# Patient Record
Sex: Female | Born: 2000
Health system: Southern US, Community
[De-identification: ages and names within clinical notes are randomized; demographics above are authoritative.]

## PROBLEM LIST (undated history)

## (undated) DIAGNOSIS — F32A Depression, unspecified: Secondary | ICD-10-CM

## (undated) DIAGNOSIS — B2790 Infectious mononucleosis, unspecified without complication: Secondary | ICD-10-CM

## (undated) DIAGNOSIS — F419 Anxiety disorder, unspecified: Secondary | ICD-10-CM

## (undated) DIAGNOSIS — Z1589 Genetic susceptibility to other disease: Secondary | ICD-10-CM

## (undated) HISTORY — DX: Anxiety disorder, unspecified: F41.9

## (undated) HISTORY — DX: Depression, unspecified: F32.A

## (undated) HISTORY — PX: NO PAST SURGERIES: SHX2092

---
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
## 2022-01-10 DIAGNOSIS — S060X0A Concussion without loss of consciousness, initial encounter: Secondary | ICD-10-CM | POA: Diagnosis not present

## 2022-01-10 DIAGNOSIS — R110 Nausea: Secondary | ICD-10-CM | POA: Diagnosis not present

## 2022-01-29 DIAGNOSIS — R69 Illness, unspecified: Secondary | ICD-10-CM | POA: Diagnosis not present

## 2022-01-29 DIAGNOSIS — F411 Generalized anxiety disorder: Secondary | ICD-10-CM | POA: Diagnosis not present

## 2022-02-26 DIAGNOSIS — R69 Illness, unspecified: Secondary | ICD-10-CM | POA: Diagnosis not present

## 2022-02-26 DIAGNOSIS — F411 Generalized anxiety disorder: Secondary | ICD-10-CM | POA: Diagnosis not present

## 2022-02-27 DIAGNOSIS — S46912A Strain of unspecified muscle, fascia and tendon at shoulder and upper arm level, left arm, initial encounter: Secondary | ICD-10-CM | POA: Diagnosis not present

## 2022-03-07 DIAGNOSIS — B349 Viral infection, unspecified: Secondary | ICD-10-CM | POA: Diagnosis not present

## 2022-03-07 DIAGNOSIS — J029 Acute pharyngitis, unspecified: Secondary | ICD-10-CM | POA: Diagnosis not present

## 2022-03-28 DIAGNOSIS — Z713 Dietary counseling and surveillance: Secondary | ICD-10-CM | POA: Diagnosis not present

## 2022-03-28 DIAGNOSIS — Z23 Encounter for immunization: Secondary | ICD-10-CM | POA: Diagnosis not present

## 2022-03-28 DIAGNOSIS — Z0001 Encounter for general adult medical examination with abnormal findings: Secondary | ICD-10-CM | POA: Diagnosis not present

## 2022-03-28 DIAGNOSIS — Z68.41 Body mass index (BMI) pediatric, 5th percentile to less than 85th percentile for age: Secondary | ICD-10-CM | POA: Diagnosis not present

## 2022-03-28 DIAGNOSIS — N921 Excessive and frequent menstruation with irregular cycle: Secondary | ICD-10-CM | POA: Diagnosis not present

## 2022-03-28 DIAGNOSIS — R69 Illness, unspecified: Secondary | ICD-10-CM | POA: Diagnosis not present

## 2022-04-20 DIAGNOSIS — H209 Unspecified iridocyclitis: Secondary | ICD-10-CM | POA: Diagnosis not present

## 2022-04-27 DIAGNOSIS — R5383 Other fatigue: Secondary | ICD-10-CM | POA: Diagnosis not present

## 2022-04-27 DIAGNOSIS — A0839 Other viral enteritis: Secondary | ICD-10-CM | POA: Diagnosis not present

## 2022-04-27 DIAGNOSIS — R112 Nausea with vomiting, unspecified: Secondary | ICD-10-CM | POA: Diagnosis not present

## 2022-05-03 DIAGNOSIS — H209 Unspecified iridocyclitis: Secondary | ICD-10-CM | POA: Diagnosis not present

## 2022-05-08 ENCOUNTER — Encounter: Payer: Self-pay | Admitting: Obstetrics and Gynecology

## 2022-05-08 ENCOUNTER — Ambulatory Visit (INDEPENDENT_AMBULATORY_CARE_PROVIDER_SITE_OTHER): Payer: 59 | Admitting: Obstetrics and Gynecology

## 2022-05-08 ENCOUNTER — Other Ambulatory Visit (HOSPITAL_COMMUNITY)
Admission: RE | Admit: 2022-05-08 | Discharge: 2022-05-08 | Disposition: A | Discharge status: Home / Self Care | Payer: 59 | Source: Ambulatory Visit | Attending: Obstetrics and Gynecology | Admitting: Obstetrics and Gynecology

## 2022-05-08 VITALS — BP 90/60 | Ht 66.0 in | Wt 139.0 lb

## 2022-05-08 DIAGNOSIS — Z113 Encounter for screening for infections with a predominantly sexual mode of transmission: Secondary | ICD-10-CM | POA: Insufficient documentation

## 2022-05-08 DIAGNOSIS — N898 Other specified noninflammatory disorders of vagina: Secondary | ICD-10-CM | POA: Diagnosis not present

## 2022-05-08 DIAGNOSIS — Z30011 Encounter for initial prescription of contraceptive pills: Secondary | ICD-10-CM

## 2022-05-08 DIAGNOSIS — R69 Illness, unspecified: Secondary | ICD-10-CM | POA: Diagnosis not present

## 2022-05-08 LAB — POCT WET PREP WITH KOH
Clue Cells Wet Prep HPF POC: NEGATIVE
KOH Prep POC: NEGATIVE
Trichomonas, UA: NEGATIVE
Yeast Wet Prep HPF POC: NEGATIVE

## 2022-05-08 MED ORDER — DROSPIRENONE-ETHINYL ESTRADIOL 3-0.02 MG PO TABS: 1.0000 | ORAL_TABLET | Freq: Every day | ORAL | 3 refills | Status: AC

## 2022-05-08 NOTE — Progress Notes (Signed)
? ? ?[facility] ? ? ?Chief Complaint  ?Patient presents with  ? Vaginal Discharge  ?  Sour, itchiness and irritation x 2-3 days  ? ? ?HPI: ?     Sarah Garner is a 21 y.o. G0P0000 whose LMP was Patient's last menstrual period was 04/24/2022 (approximate)., presents today for increased vag d/c with irritation, sour odor, no fishy odor, dysuria without other UTI sx for a few days. Hx of yeast vag in past but sx aren't the same. No yeast meds to treat, no recent abx use. She is sex active, using condoms. Has noticed some vag discomfort with sex the past month. Due for yearly STD testing.  ?She is using condoms. Did lomedia last yr but increased her anxiety/depression sx so stopped it. Interested in [place] again. Hx of irreg menses. ? ? ?Past Medical History:  ?Diagnosis Date  ? Anxiety   ? Depression   ? HLA B27 (HLA B27 positive)   ? Mononucleosis   ? ? ? ?Past Surgical History:  ?Procedure Laterality Date  ? NO PAST SURGERIES    ? ? ?History reviewed. No pertinent family history. ? ?Social History  ? ?Socioeconomic History  ? Marital status: Single  ?  Spouse name: Not on file  ? Number of children: Not on file  ? Years of education: Not on file  ? Highest education level: Not on file  ?Occupational History  ? Not on file  ?Tobacco Use  ? Smoking status: Never  ? Smokeless tobacco: Never  ?Vaping Use  ? Vaping Use: Never used  ?Substance and Sexual Activity  ? Alcohol use: Never  ? Drug use: Never  ? Sexual activity: Yes  ?  Birth control/protection: None, Condom  ?Other Topics Concern  ? Not on file  ?Social History Narrative  ? Not on file  ? ?Social Determinants of Health  ? ?Financial Resource Strain: Not on file  ?Food Insecurity: Not on file  ?Transportation Needs: Not on file  ?Physical Activity: Not on file  ?Stress: Not on file  ?Social Connections: Not on file  ?Intimate Partner Violence: Not on file  ? ? ?Outpatient Medications Prior to Visit  ?Medication Sig Dispense Refill  ?  ARIPiprazole (ABILIFY) 2 MG tablet Take 2 mg by mouth daily.    ? Cranberry 400 MG CAPS Take by mouth.    ? Difluprednate 0.05 % EMUL Apply to eye.    ? DULoxetine HCl 40 MG CPEP     ? Multiple Vitamin (MULTI-VITAMIN) tablet Take by mouth.    ? ondansetron (ZOFRAN-ODT) 4 MG disintegrating tablet Take 4 mg by mouth every 6 (six) hours as needed.    ? Probiotic CAPS Take by mouth.    ? QUEtiapine (SEROQUEL) 50 MG tablet     ? fluconazole (DIFLUCAN) 150 MG tablet TAKE 1 TABLET(150 MG) BY MOUTH 1 TIME FOR 1 DOSE. MAY REPEAT IN 3 DAYS IF STILL HAVING SYMPTOMS 2 tablet 0  ? FLUoxetine (PROZAC) 40 MG capsule Take 40 mg by mouth daily.    ? meloxicam (MOBIC) 15 MG tablet Take 1 tablet (15 mg total) by mouth daily. 30 tablet 0  ? methocarbamol (ROBAXIN) 500 MG tablet Take 1 tablet (500 mg total) by mouth 4 (four) times daily. 28 tablet 0  ? Norethindrone Acetate-Ethinyl Estrad-FE (MICROGESTIN 24 FE) 1-20 MG-MCG(24) tablet Take 1 tablet by mouth daily. 84 tablet 3  ? ?No facility-administered medications prior to visit.  ? ? ? ? ?ROS: ? ?  Review of Systems  ?Constitutional:  Negative for fever.  ?Gastrointestinal:  Negative for blood in stool, constipation, diarrhea, nausea and vomiting.  ?Genitourinary:  Positive for dysuria and vaginal discharge. Negative for dyspareunia, flank pain, frequency, hematuria, urgency, vaginal bleeding and vaginal pain.  ?Musculoskeletal:  Negative for back pain.  ?Skin:  Negative for rash.  ?BREAST: No symptoms ? ? ?OBJECTIVE:  ? ?Vitals:  ?BP 90/60   Ht $R'5\' 6"'Ji$  (1.676 m)   Wt 139 lb (63 kg)   LMP 04/24/2022 (Approximate)   BMI 22.44 kg/m?  ? ?Physical Exam ?Vitals reviewed.  ?Constitutional:   ?   Appearance: She is well-developed.  ?Pulmonary:  ?   Effort: Pulmonary effort is normal.  ?Genitourinary: ?   General: Normal vulva.  ?   Pubic Area: No rash.   ?   Labia:     ?   Right: No rash, tenderness or lesion.     ?   Left: No rash, tenderness or lesion.   ?   Vagina: Vaginal discharge  present. No erythema or tenderness.  ?   Cervix: Normal.  ?   Uterus: Normal. Not enlarged and not tender.   ?   Adnexa: Right adnexa normal and left adnexa normal.    ?   Right: No mass or tenderness.      ?   Left: No mass or tenderness.    ?Musculoskeletal:     ?   General: Normal range of motion.  ?   Cervical back: Normal range of motion.  ?Skin: ?   General: Skin is warm and dry.  ?Neurological:  ?   General: No focal deficit present.  ?   Mental Status: She is alert and oriented to person, place, and time.  ?Psychiatric:     ?   Mood and Affect: Mood normal.     ?   Behavior: Behavior normal.     ?   Thought Content: Thought content normal.     ?   Judgment: Judgment normal.  ? ? ?Results: ?Results for orders placed or performed in visit on 05/08/22 (from the past 24 hour(s))  ?POCT Wet Prep with KOH     Status: Normal  ? Collection Time: 05/08/22  4:42 PM  ?Result Value Ref Range  ? Trichomonas, UA Negative   ? Clue Cells Wet Prep HPF POC neg   ? Epithelial Wet Prep HPF POC    ? Yeast Wet Prep HPF POC neg   ? Bacteria Wet Prep HPF POC    ? RBC Wet Prep HPF POC    ? WBC Wet Prep HPF POC    ? KOH Prep POC Negative Negative  ? ? ? ?Assessment/Plan: ?Vaginal discharge - Plan: POCT Wet Prep with KOH, Cervicovaginal ancillary only; neg wet prep, pos sx. Check culture. Will f/u with results. ? ?Screening for STD (sexually transmitted disease) - Plan: Cervicovaginal ancillary only ? ?Encounter for initial prescription of contraceptive pills - Plan: drospirenone-ethinyl estradiol (YAZ) 3-0.02 MG tablet; BC options discussed. Pt would like to try different OCP. Rx yaz, start with menses. Will do provera if no spontaneous menses in a month or so. Condoms.  ? ? ?Meds ordered this encounter  ?Medications  ? drospirenone-ethinyl estradiol (YAZ) 3-0.02 MG tablet  ?  Sig: Take 1 tablet by mouth daily.  ?  Dispense:  84 tablet  ?  Refill:  3  ?  Order Specific Question:   Supervising Provider  ?  Answer:  CONSTANT, [place]  ? ? ? ? Return if symptoms worsen or fail to improve. ? ?Karrisa Didio B. Ileah Falkenstein, PA-C ?05/08/2022 ?4:44 PM ? ? ? ? ? ?

## 2022-05-10 ENCOUNTER — Telehealth: Payer: Self-pay | Admitting: Obstetrics and Gynecology

## 2022-05-10 DIAGNOSIS — A749 Chlamydial infection, unspecified: Secondary | ICD-10-CM

## 2022-05-10 LAB — CERVICOVAGINAL ANCILLARY ONLY
Candida Glabrata: NEGATIVE
Candida Vaginitis: NEGATIVE
Chlamydia: POSITIVE — AB
Comment: NEGATIVE
Comment: NEGATIVE
Comment: NEGATIVE
Comment: NEGATIVE
Comment: NORMAL
Neisseria Gonorrhea: NEGATIVE
Trichomonas: NEGATIVE

## 2022-05-10 MED ORDER — AZITHROMYCIN 500 MG PO TABS: 1000.0000 mg | ORAL_TABLET | Freq: Once | ORAL | 0 refills | Status: AC

## 2022-05-10 NOTE — Progress Notes (Signed)
Pls notify [facility]

## 2022-05-10 NOTE — Telephone Encounter (Signed)
Pt aware of chlamydia. Rx azithro eRxd, partner needs tx. No sex activity until 1 wk after both completed tx; CMA to notify [facility]. RTO in 4 wks for TOC

## 2022-05-14 NOTE — Progress Notes (Signed)
[facility] notified. 

## 2022-05-25 DIAGNOSIS — R69 Illness, unspecified: Secondary | ICD-10-CM | POA: Diagnosis not present

## 2022-05-25 DIAGNOSIS — F411 Generalized anxiety disorder: Secondary | ICD-10-CM | POA: Diagnosis not present

## 2022-06-19 DIAGNOSIS — R5383 Other fatigue: Secondary | ICD-10-CM | POA: Diagnosis not present

## 2022-06-19 DIAGNOSIS — Z13220 Encounter for screening for lipoid disorders: Secondary | ICD-10-CM | POA: Diagnosis not present

## 2022-06-22 DIAGNOSIS — B349 Viral infection, unspecified: Secondary | ICD-10-CM | POA: Diagnosis not present

## 2022-06-22 DIAGNOSIS — J029 Acute pharyngitis, unspecified: Secondary | ICD-10-CM | POA: Diagnosis not present

## 2022-07-25 DIAGNOSIS — R69 Illness, unspecified: Secondary | ICD-10-CM | POA: Diagnosis not present

## 2022-07-25 DIAGNOSIS — F411 Generalized anxiety disorder: Secondary | ICD-10-CM | POA: Diagnosis not present

## 2022-07-25 DIAGNOSIS — Z79899 Other long term (current) drug therapy: Secondary | ICD-10-CM | POA: Diagnosis not present

## 2022-08-13 DIAGNOSIS — R69 Illness, unspecified: Secondary | ICD-10-CM | POA: Diagnosis not present

## 2022-09-05 DIAGNOSIS — R69 Illness, unspecified: Secondary | ICD-10-CM | POA: Diagnosis not present

## 2022-09-06 DIAGNOSIS — R69 Illness, unspecified: Secondary | ICD-10-CM | POA: Diagnosis not present

## 2022-09-07 DIAGNOSIS — R69 Illness, unspecified: Secondary | ICD-10-CM | POA: Diagnosis not present

## 2022-09-08 DIAGNOSIS — R69 Illness, unspecified: Secondary | ICD-10-CM | POA: Diagnosis not present

## 2022-09-09 DIAGNOSIS — R69 Illness, unspecified: Secondary | ICD-10-CM | POA: Diagnosis not present

## 2022-09-10 DIAGNOSIS — R69 Illness, unspecified: Secondary | ICD-10-CM | POA: Diagnosis not present

## 2022-09-11 DIAGNOSIS — R69 Illness, unspecified: Secondary | ICD-10-CM | POA: Diagnosis not present

## 2022-09-12 DIAGNOSIS — R69 Illness, unspecified: Secondary | ICD-10-CM | POA: Diagnosis not present

## 2022-09-13 DIAGNOSIS — R69 Illness, unspecified: Secondary | ICD-10-CM | POA: Diagnosis not present

## 2022-09-14 DIAGNOSIS — R69 Illness, unspecified: Secondary | ICD-10-CM | POA: Diagnosis not present

## 2022-09-15 DIAGNOSIS — R69 Illness, unspecified: Secondary | ICD-10-CM | POA: Diagnosis not present

## 2022-09-16 DIAGNOSIS — R69 Illness, unspecified: Secondary | ICD-10-CM | POA: Diagnosis not present

## 2022-09-17 DIAGNOSIS — R69 Illness, unspecified: Secondary | ICD-10-CM | POA: Diagnosis not present

## 2022-09-18 DIAGNOSIS — R69 Illness, unspecified: Secondary | ICD-10-CM | POA: Diagnosis not present

## 2022-09-19 DIAGNOSIS — R69 Illness, unspecified: Secondary | ICD-10-CM | POA: Diagnosis not present

## 2022-09-20 DIAGNOSIS — R69 Illness, unspecified: Secondary | ICD-10-CM | POA: Diagnosis not present

## 2022-09-21 DIAGNOSIS — R69 Illness, unspecified: Secondary | ICD-10-CM | POA: Diagnosis not present

## 2022-09-22 DIAGNOSIS — R69 Illness, unspecified: Secondary | ICD-10-CM | POA: Diagnosis not present

## 2022-09-23 DIAGNOSIS — R69 Illness, unspecified: Secondary | ICD-10-CM | POA: Diagnosis not present

## 2022-09-24 DIAGNOSIS — R69 Illness, unspecified: Secondary | ICD-10-CM | POA: Diagnosis not present

## 2022-09-25 DIAGNOSIS — R69 Illness, unspecified: Secondary | ICD-10-CM | POA: Diagnosis not present

## 2022-09-26 DIAGNOSIS — R69 Illness, unspecified: Secondary | ICD-10-CM | POA: Diagnosis not present

## 2022-09-27 DIAGNOSIS — R69 Illness, unspecified: Secondary | ICD-10-CM | POA: Diagnosis not present

## 2022-09-28 DIAGNOSIS — R69 Illness, unspecified: Secondary | ICD-10-CM | POA: Diagnosis not present

## 2022-09-29 DIAGNOSIS — R69 Illness, unspecified: Secondary | ICD-10-CM | POA: Diagnosis not present

## 2022-09-30 DIAGNOSIS — R69 Illness, unspecified: Secondary | ICD-10-CM | POA: Diagnosis not present

## 2022-10-01 DIAGNOSIS — R69 Illness, unspecified: Secondary | ICD-10-CM | POA: Diagnosis not present

## 2022-10-02 DIAGNOSIS — R69 Illness, unspecified: Secondary | ICD-10-CM | POA: Diagnosis not present

## 2022-10-03 DIAGNOSIS — R69 Illness, unspecified: Secondary | ICD-10-CM | POA: Diagnosis not present

## 2022-10-04 DIAGNOSIS — R69 Illness, unspecified: Secondary | ICD-10-CM | POA: Diagnosis not present

## 2022-10-05 DIAGNOSIS — R69 Illness, unspecified: Secondary | ICD-10-CM | POA: Diagnosis not present

## 2022-10-06 DIAGNOSIS — R69 Illness, unspecified: Secondary | ICD-10-CM | POA: Diagnosis not present

## 2022-10-07 DIAGNOSIS — R69 Illness, unspecified: Secondary | ICD-10-CM | POA: Diagnosis not present

## 2022-10-08 DIAGNOSIS — R69 Illness, unspecified: Secondary | ICD-10-CM | POA: Diagnosis not present

## 2022-10-09 DIAGNOSIS — R69 Illness, unspecified: Secondary | ICD-10-CM | POA: Diagnosis not present

## 2022-10-10 DIAGNOSIS — R69 Illness, unspecified: Secondary | ICD-10-CM | POA: Diagnosis not present

## 2022-10-11 DIAGNOSIS — R69 Illness, unspecified: Secondary | ICD-10-CM | POA: Diagnosis not present

## 2022-10-12 DIAGNOSIS — R69 Illness, unspecified: Secondary | ICD-10-CM | POA: Diagnosis not present

## 2022-11-09 ENCOUNTER — Other Ambulatory Visit (HOSPITAL_COMMUNITY)
Admission: RE | Admit: 2022-11-09 | Discharge: 2022-11-09 | Disposition: A | Discharge status: Home / Self Care | Payer: 59 | Source: Ambulatory Visit | Attending: Obstetrics and Gynecology | Admitting: Obstetrics and Gynecology

## 2022-11-09 ENCOUNTER — Ambulatory Visit (INDEPENDENT_AMBULATORY_CARE_PROVIDER_SITE_OTHER): Payer: 59

## 2022-11-09 VITALS — BP 100/60 | Ht 67.0 in | Wt 139.0 lb

## 2022-11-09 DIAGNOSIS — B379 Candidiasis, unspecified: Secondary | ICD-10-CM

## 2022-11-09 DIAGNOSIS — N898 Other specified noninflammatory disorders of vagina: Secondary | ICD-10-CM

## 2022-11-09 DIAGNOSIS — H209 Unspecified iridocyclitis: Secondary | ICD-10-CM | POA: Diagnosis not present

## 2022-11-09 MED ORDER — FLUCONAZOLE 150 MG PO TABS: 150.0000 mg | ORAL_TABLET | Freq: Once | ORAL | 0 refills | Status: AC

## 2022-11-09 NOTE — Progress Notes (Signed)
She complains of vaginal discharge for 5 days; described as itchy, white, non bloody, without pelvic pain or abnormal vaginal bleeding.

## 2022-11-13 LAB — CERVICOVAGINAL ANCILLARY ONLY
Candida Glabrata: NEGATIVE
Candida Vaginitis: POSITIVE — AB
Comment: NEGATIVE
Comment: NEGATIVE

## 2022-12-28 DIAGNOSIS — J019 Acute sinusitis, unspecified: Secondary | ICD-10-CM | POA: Diagnosis not present

## 2023-01-08 DIAGNOSIS — R69 Illness, unspecified: Secondary | ICD-10-CM | POA: Diagnosis not present

## 2023-01-08 DIAGNOSIS — F32A Depression, unspecified: Secondary | ICD-10-CM | POA: Diagnosis not present

## 2023-01-22 DIAGNOSIS — G9332 Myalgic encephalomyelitis/chronic fatigue syndrome: Secondary | ICD-10-CM | POA: Diagnosis not present

## 2023-01-22 DIAGNOSIS — B999 Unspecified infectious disease: Secondary | ICD-10-CM | POA: Diagnosis not present

## 2023-01-28 DIAGNOSIS — R69 Illness, unspecified: Secondary | ICD-10-CM | POA: Diagnosis not present

## 2023-01-28 DIAGNOSIS — F32A Depression, unspecified: Secondary | ICD-10-CM | POA: Diagnosis not present

## 2023-02-07 DIAGNOSIS — R69 Illness, unspecified: Secondary | ICD-10-CM | POA: Diagnosis not present

## 2023-02-07 DIAGNOSIS — F32A Depression, unspecified: Secondary | ICD-10-CM | POA: Diagnosis not present
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
## 2024-07-28 ENCOUNTER — Ambulatory Visit (LOCAL_COMMUNITY_HEALTH_CENTER): Payer: Self-pay

## 2024-07-28 DIAGNOSIS — Z111 Encounter for screening for respiratory tuberculosis: Secondary | ICD-10-CM

## 2024-07-30 ENCOUNTER — Ambulatory Visit: Payer: Self-pay

## 2024-07-30 DIAGNOSIS — Z111 Encounter for screening for respiratory tuberculosis: Secondary | ICD-10-CM

## 2024-07-30 LAB — TB SKIN TEST
Induration: 0 mm
TB Skin Test: NEGATIVE
